# Patient Record
Sex: Female | Born: 1990 | Race: Black or African American | Hispanic: No | Marital: Single | Smoking: Never smoker
Health system: Southern US, Community
[De-identification: ages and names within clinical notes are randomized; demographics above are authoritative.]

## PROBLEM LIST (undated history)

## (undated) ENCOUNTER — Inpatient Hospital Stay (HOSPITAL_COMMUNITY): Payer: Self-pay

## (undated) DIAGNOSIS — Z789 Other specified health status: Secondary | ICD-10-CM

## (undated) HISTORY — PX: TONSILLECTOMY: SUR1361

---
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
## 2011-01-04 LAB — DIFFERENTIAL
Lymphocytes Relative: 31
Lymphs Abs: 1.1
Monocytes Relative: 16 — ABNORMAL HIGH
Neutro Abs: 1.8
Neutrophils Relative %: 52

## 2011-01-04 LAB — URINALYSIS, ROUTINE W REFLEX MICROSCOPIC
Glucose, UA: NEGATIVE
Leukocytes, UA: NEGATIVE
Protein, ur: NEGATIVE
Specific Gravity, Urine: 1.024
pH: 6

## 2011-01-04 LAB — COMPREHENSIVE METABOLIC PANEL
CO2: 23
Calcium: 8.6
Creatinine, Ser: 0.68
Glucose, Bld: 102 — ABNORMAL HIGH
Total Protein: 6.4

## 2011-01-04 LAB — URINE MICROSCOPIC-ADD ON

## 2011-01-04 LAB — CBC
Hemoglobin: 10.3 — ABNORMAL LOW
MCHC: 31.6
MCV: 78
RBC: 4.19
RDW: 16.2 — ABNORMAL HIGH

## 2017-03-27 ENCOUNTER — Inpatient Hospital Stay (HOSPITAL_COMMUNITY)
Admission: AD | Admit: 2017-03-27 | Discharge: 2017-03-27 | Disposition: A | Discharge status: Home / Self Care | Payer: Federal, State, Local not specified - PPO | Source: Ambulatory Visit | Attending: Obstetrics and Gynecology | Admitting: Obstetrics and Gynecology

## 2017-03-27 ENCOUNTER — Encounter (HOSPITAL_COMMUNITY): Payer: Self-pay | Admitting: *Deleted

## 2017-03-27 ENCOUNTER — Inpatient Hospital Stay (HOSPITAL_COMMUNITY): Payer: Federal, State, Local not specified - PPO

## 2017-03-27 DIAGNOSIS — Z3A01 Less than 8 weeks gestation of pregnancy: Secondary | ICD-10-CM | POA: Insufficient documentation

## 2017-03-27 DIAGNOSIS — R109 Unspecified abdominal pain: Secondary | ICD-10-CM | POA: Insufficient documentation

## 2017-03-27 DIAGNOSIS — O200 Threatened abortion: Secondary | ICD-10-CM | POA: Diagnosis not present

## 2017-03-27 DIAGNOSIS — O26891 Other specified pregnancy related conditions, first trimester: Secondary | ICD-10-CM | POA: Diagnosis present

## 2017-03-27 DIAGNOSIS — O209 Hemorrhage in early pregnancy, unspecified: Secondary | ICD-10-CM

## 2017-03-27 HISTORY — DX: Other specified health status: Z78.9

## 2017-03-27 LAB — CBC
HCT: 37.4 % (ref 36.0–46.0)
Hemoglobin: 11.6 g/dL — ABNORMAL LOW (ref 12.0–15.0)
MCH: 24.9 pg — ABNORMAL LOW (ref 26.0–34.0)
MCHC: 31 g/dL (ref 30.0–36.0)
MCV: 80.3 fL (ref 78.0–100.0)
Platelets: 243 10*3/uL (ref 150–400)
RBC: 4.66 MIL/uL (ref 3.87–5.11)
RDW: 19.2 % — ABNORMAL HIGH (ref 11.5–15.5)
WBC: 11.8 10*3/uL — ABNORMAL HIGH (ref 4.0–10.5)

## 2017-03-27 LAB — URINALYSIS, ROUTINE W REFLEX MICROSCOPIC
Bilirubin Urine: NEGATIVE
Glucose, UA: NEGATIVE mg/dL
Ketones, ur: NEGATIVE mg/dL
Leukocytes, UA: NEGATIVE
Nitrite: NEGATIVE
Protein, ur: NEGATIVE mg/dL
Specific Gravity, Urine: 1.014 (ref 1.005–1.030)
pH: 5 (ref 5.0–8.0)

## 2017-03-27 LAB — HCG, QUANTITATIVE, PREGNANCY: hCG, Beta Chain, Quant, S: 1126 m[IU]/mL — ABNORMAL HIGH (ref <5)

## 2017-03-27 LAB — POCT PREGNANCY, URINE: Preg Test, Ur: POSITIVE — AB

## 2017-03-27 LAB — WET PREP, GENITAL
Clue Cells Wet Prep HPF POC: NONE SEEN
Sperm: NONE SEEN
Trich, Wet Prep: NONE SEEN
Yeast Wet Prep HPF POC: NONE SEEN

## 2017-03-27 LAB — ABO/RH: ABO/RH(D): O POS

## 2017-03-27 MED ORDER — ACETAMINOPHEN 500 MG PO TABS
1000.0000 mg | ORAL_TABLET | Freq: Once | ORAL | Status: AC
  Administered 2017-03-27: 1000 mg via ORAL
  Filled 2017-03-27: qty 2

## 2017-03-27 NOTE — MAU Note (Signed)
Pt presents to MAU c/o vaginal bleeding and abdominal cramping. Pt states that the bleeding started around 1830 pt states she has only wore one pad since then. Pt reports a lower abdominal cramping rating it at a 7/10.

## 2017-03-27 NOTE — MAU Provider Note (Signed)
Chief Complaint: Possible Pregnancy; Vaginal Bleeding; and Abdominal Pain   First Provider Initiated Contact with Patient 03/27/17 [phone]     SUBJECTIVE HPI: Lindsay Navarro is a 26 y.o. G1P0 at [redacted]w[redacted]d by LMP who presents to maternity admissions reporting vaginal bleeding and abdominal pain. [place] from [place]- Has had HCG level and US in [place]. Pulled up records on phone- HCG on 12/19 was 1,185 and US on 12/19 showed IUP @ 6w with small subchorionic hemorrhage and cardiac activity of 104. She reports that she started bleeding last night around 1900, it began as dark red bleeding then around 2300 turned bright red with small clots. Abdominal cramping began around 2300 with bright red bleeding- pain is specified to lower abdominal area, has not taken any medication for pain relief. Abdominal pain continued to get worse overnight- which brought her into MAU this morning. She denies vaginal itching/burning, urinary symptoms, h/a, dizziness, n/v, or fever/chills.    Past Medical History:  Diagnosis Date  . Medical history non-contributory    Past Surgical History:  Procedure Laterality Date  . TONSILLECTOMY     Social History   Socioeconomic History  . Marital status: Single    Spouse name: Not on file  . Number of children: Not on file  . Years of education: Not on file  . Highest education level: Not on file  Social Needs  . Financial resource strain: Not on file  . Food insecurity - worry: Not on file  . Food insecurity - inability: Not on file  . Transportation needs - medical: Not on file  . Transportation needs - non-medical: Not on file  Occupational History  . Not on file  Tobacco Use  . Smoking status: Never Smoker  . Smokeless tobacco: Never Used  Substance and Sexual Activity  . Alcohol use: No    Frequency: Never  . Drug use: No  . Sexual activity: Not Currently  Other Topics Concern  . Not on file  Social History Narrative  . Not on file    No current facility-administered medications on file prior to encounter.    Current Outpatient Medications on File Prior to Encounter  Medication Sig Dispense Refill  . acetaminophen (TYLENOL) 325 MG tablet Take 650 mg by mouth every 6 (six) hours as needed.    . IRON CR PO Take by mouth.    . Prenatal Vit-Fe Fumarate-FA (PRENATAL MULTIVITAMIN) TABS tablet Take 1 tablet by mouth daily at 12 noon.    . progesterone (PROMETRIUM) 200 MG capsule Take 200 mg by mouth daily.     No Known Allergies  ROS:  Review of Systems  Constitutional: Negative.   Respiratory: Negative.   Cardiovascular: Negative.   Gastrointestinal: Positive for abdominal pain. Negative for constipation, diarrhea, nausea and vomiting.  Genitourinary: Positive for vaginal bleeding. Negative for difficulty urinating, dysuria, frequency, urgency and vaginal pain.  Musculoskeletal: Negative.   Skin: Negative.   Neurological: Negative.   Psychiatric/Behavioral: Negative.    I have reviewed patient's Past Medical Hx, Surgical Hx, Family Hx, Social Hx, medications and allergies.   Physical Exam   Patient Vitals for the past 24 hrs:  BP Temp Temp src Pulse Resp Height Weight  03/27/17 0628 133/64 98.4 F (36.9 C) Oral 91 18 5\' 9"  (1.753 m) 299 lb 0.6 oz (135.6 kg)   Constitutional: Well-developed, well-nourished female in no acute distress.  Cardiovascular: normal rate Respiratory: normal effort GI: Abd soft, non-tender. Pos BS x 4 MS:  Extremities nontender, no edema, normal ROM Neurologic: Alert and oriented x 4.  GU: Neg CVAT.  PELVIC EXAM: Cervix pink, visually closed, without lesion, moderate dark red blood with multiple small clots in vagina upon insertion of speculum, active bright red bleeding with mucus discharge from cervix, vaginal walls and external genitalia normal Bimanual exam: Cervix 0/long/high, firm, anterior, neg CMT, uterus nontender, nonenlarged, adnexa without tenderness, enlargement, or  mass  LAB RESULTS Results for orders placed or performed during the hospital encounter of 03/27/17 (from the past 24 hour(s))  Urinalysis, Routine w reflex microscopic     Status: Abnormal   Collection Time: 03/27/17  6:14 AM  Result Value Ref Range   Color, Urine YELLOW YELLOW   APPearance HAZY (A) CLEAR   Specific Gravity, Urine 1.014 1.005 - 1.030   pH 5.0 5.0 - 8.0   Glucose, UA NEGATIVE NEGATIVE mg/dL   Hgb urine dipstick LARGE (A) NEGATIVE   Bilirubin Urine NEGATIVE NEGATIVE   Ketones, ur NEGATIVE NEGATIVE mg/dL   Protein, ur NEGATIVE NEGATIVE mg/dL   Nitrite NEGATIVE NEGATIVE   Leukocytes, UA NEGATIVE NEGATIVE   RBC / HPF 6-30 0 - 5 RBC/hpf   WBC, UA 0-5 0 - 5 WBC/hpf   Bacteria, UA RARE (A) NONE SEEN   Squamous Epithelial / LPF 0-5 (A) NONE SEEN   Mucus PRESENT   Wet prep, genital     Status: Abnormal   Collection Time: 03/27/17  6:42 AM  Result Value Ref Range   Yeast Wet Prep HPF POC NONE SEEN NONE SEEN   Trich, Wet Prep NONE SEEN NONE SEEN   Clue Cells Wet Prep HPF POC NONE SEEN NONE SEEN   WBC, Wet Prep HPF POC FEW (A) NONE SEEN   Sperm NONE SEEN   CBC     Status: Abnormal   Collection Time: 03/27/17  6:44 AM  Result Value Ref Range   WBC 11.8 (H) 4.0 - 10.5 K/uL   RBC 4.66 3.87 - 5.11 MIL/uL   Hemoglobin 11.6 (L) 12.0 - 15.0 g/dL   HCT 69.637.4 29.536.0 - 28.446.0 %   MCV 80.3 78.0 - 100.0 fL   MCH 24.9 (L) 26.0 - 34.0 pg   MCHC 31.0 30.0 - 36.0 g/dL   RDW 13.219.2 (H) 44.011.5 - 10.215.5 %   Platelets 243 150 - 400 K/uL  ABO/Rh     Status: None (Preliminary result)   Collection Time: 03/27/17  6:44 AM  Result Value Ref Range   ABO/RH(D) O POS   hCG, quantitative, pregnancy     Status: Abnormal   Collection Time: 03/27/17  6:44 AM  Result Value Ref Range   hCG, Beta Chain, Quant, S 1,126 (H) <5 mIU/mL  Pregnancy, urine POC     Status: Abnormal   Collection Time: 03/27/17  6:57 AM  Result Value Ref Range   Preg Test, Ur POSITIVE (A) NEGATIVE     IMAGING [place] Ob Less Than  14 Weeks With Ob Transvaginal  Result Date: 03/27/2017 CLINICAL DATA:  Spotting EXAM: OBSTETRIC <14 WK US AND TRANSVAGINAL OB US TECHNIQUE: Both transabdominal and transvaginal ultrasound examinations were performed for complete evaluation of the gestation as well as the maternal uterus, adnexal regions, and pelvic cul-de-sac. Transvaginal technique was performed to assess early pregnancy. COMPARISON:  None. FINDINGS: Intrauterine gestational sac: Single Yolk sac:  Visualized Embryo:  Visualized Cardiac Activity: Not visualized Heart Rate:   bpm MSD:   mm    w     d CRL:  6.1  mm   6 w   3 d                  [place] EDC: 11/20/2017 Subchorionic hemorrhage:  None visualized. Maternal uterus/adnexae: No adnexal masses or free fluid. IMPRESSION: Six week 3 day intrauterine pregnancy. Fetal heart tones not visualized currently. Recommend follow-up ultrasound in 10-14 days to ensure expected progression. Electronically Signed   By: Charlett Nose M.D.   On: 03/27/2017 07:51    MAU Management/MDM: Orders Placed This Encounter  Procedures  . Wet prep, genital  . US OB Comp Less 14 Wks  . US OB Transvaginal  . Urinalysis, Routine w reflex microscopic  . CBC  . hCG, quantitative, pregnancy  . ABO/Rh    Meds ordered this encounter  Medications  . acetaminophen (TYLENOL) tablet 1,000 mg   Treatments in MAU included 1,000mg  Tylenol for abdominal pain.  Discussed results of labs and ultrasound. Educated on recommendations at this time. Offered pain medication for use at home- patient declines.  Pt discharged with bleeding precautions and Pelvic rest. Patient agrees with expectant management and wants to continue needed care in [place]- has flight back tonight.   ASSESSMENT 1. Threatened miscarriage in early pregnancy   2. Bleeding in early pregnancy   3. Abdominal pain in pregnancy, first trimester     PLAN Discharge home Follow up with HCG in 1 week and office visit in 2 weeks  Return to MAU as  needed with increased bleeding and increased pain    Allergies as of 03/27/2017   No Known Allergies     Medication List    TAKE these medications   acetaminophen 325 MG tablet Commonly known as:  TYLENOL Take 650 mg by mouth every 6 (six) hours as needed.   IRON CR PO Take by mouth.   prenatal multivitamin Tabs tablet Take 1 tablet by mouth daily at 12 noon.   progesterone 200 MG capsule Commonly known as:  PROMETRIUM Take 200 mg by mouth daily.       Steward Drone  Certified Nurse-Midwife 03/27/2017  6:56 AM

## 2017-03-28 LAB — GC/CHLAMYDIA PROBE AMP (~~LOC~~) NOT AT ARMC
Chlamydia: NEGATIVE
Neisseria Gonorrhea: NEGATIVE

## 2018-02-09 ENCOUNTER — Encounter (HOSPITAL_COMMUNITY): Payer: Self-pay

## 2019-01-29 IMAGING — US US OB < 14 WEEKS - US OB TV
1 series · 15 of 28 positions shown · non-contrast
Comparison: None.

CLINICAL DATA: Spotting

EXAM:
OBSTETRIC <14 WK US AND TRANSVAGINAL OB US
TECHNIQUE: Both transabdominal and transvaginal ultrasound examinations were
performed for complete evaluation of the gestation as well as the
maternal uterus, adnexal regions, and pelvic cul-de-sac.
Transvaginal technique was performed to assess early pregnancy.

[Series 1: us ob < 14 weeks - us ob tv · 43 acquisitions, 15 frames shown]
[im 1/43]
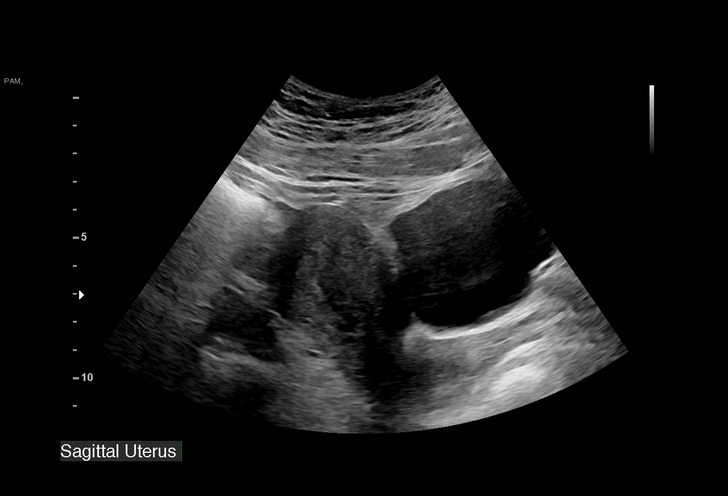
[im 4/43]
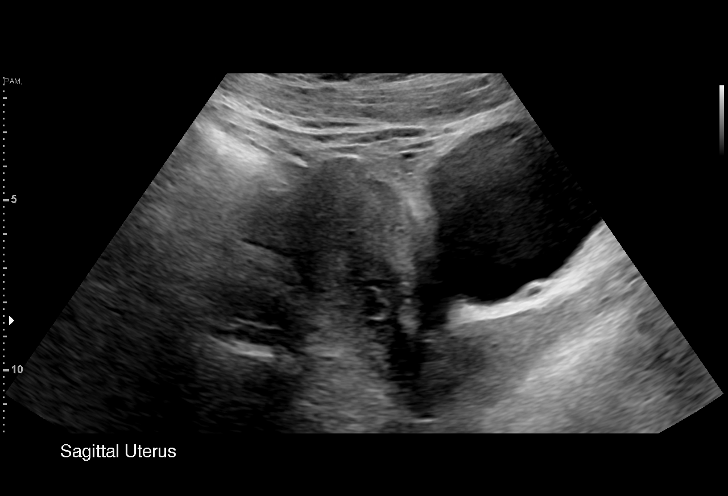
[im 7/43]
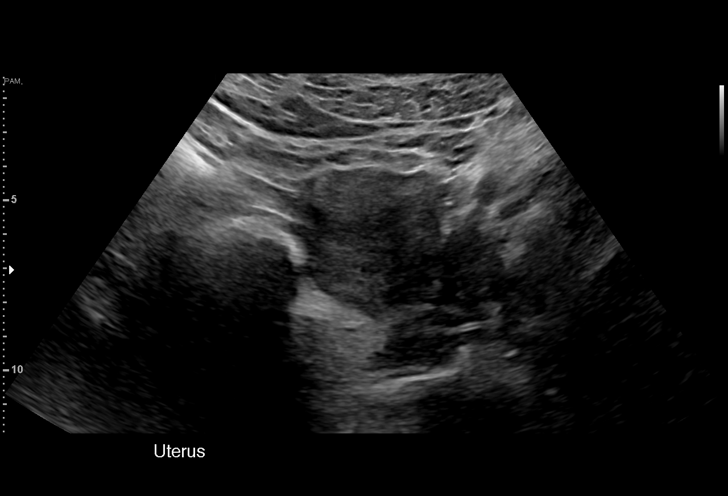
[im 10/43]
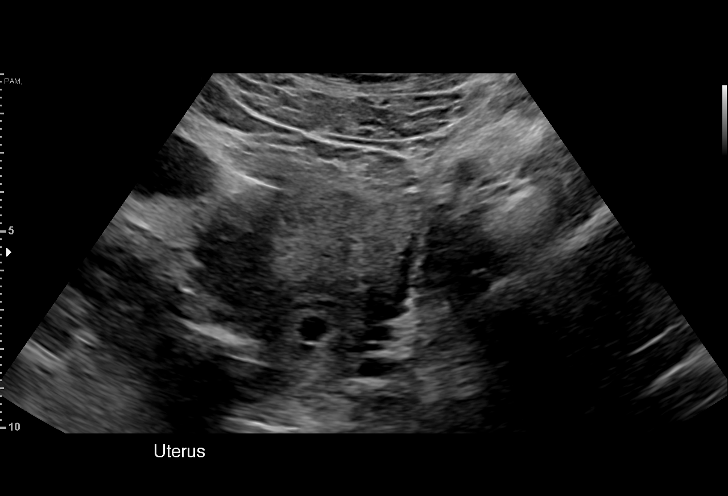
[im 13/43]
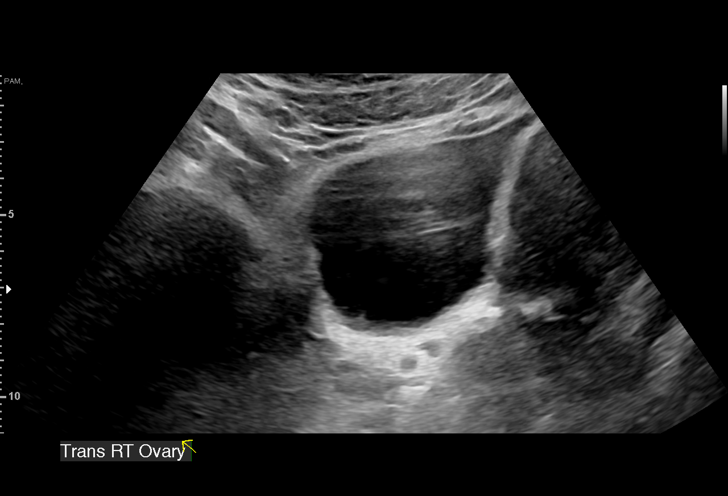
[im 16/43]
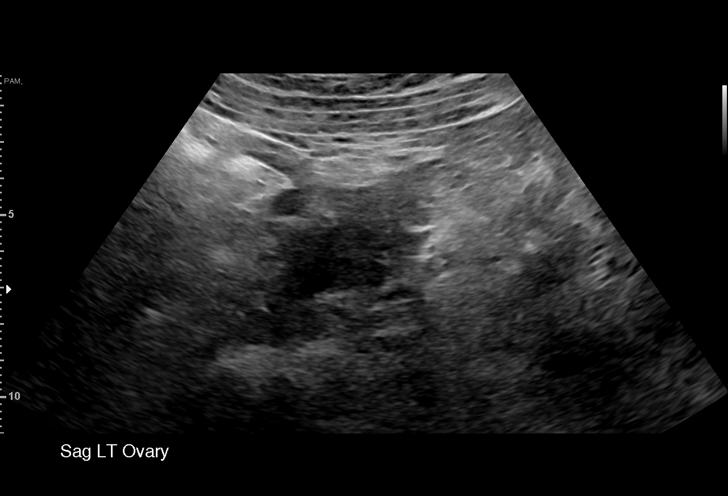
[im 19/43]
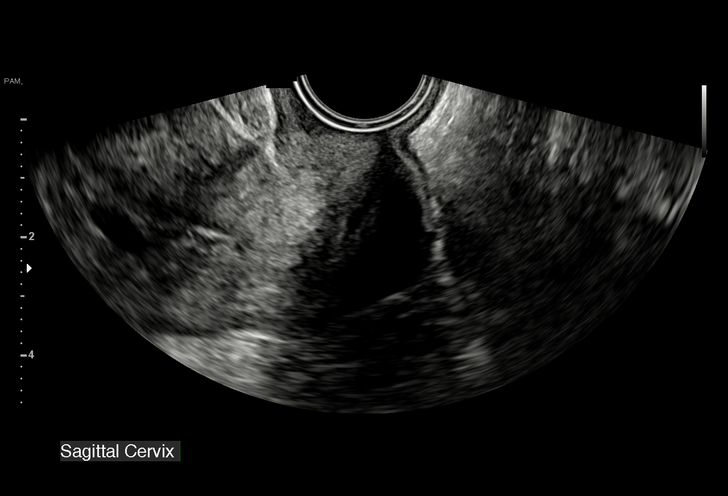
[im 22/43]
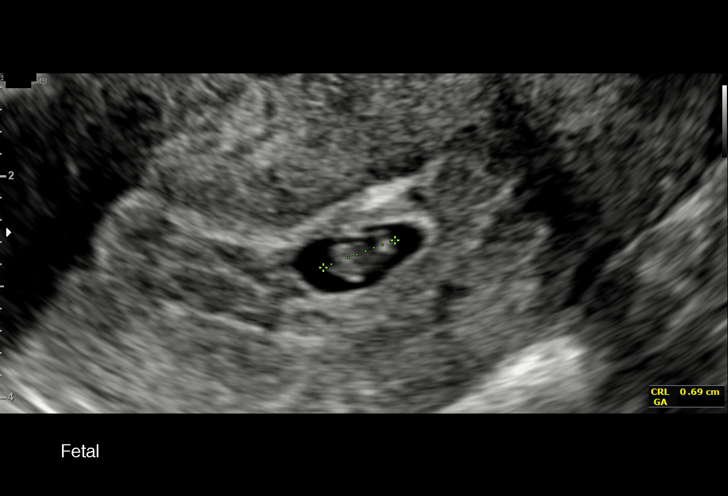
[im 24/43]
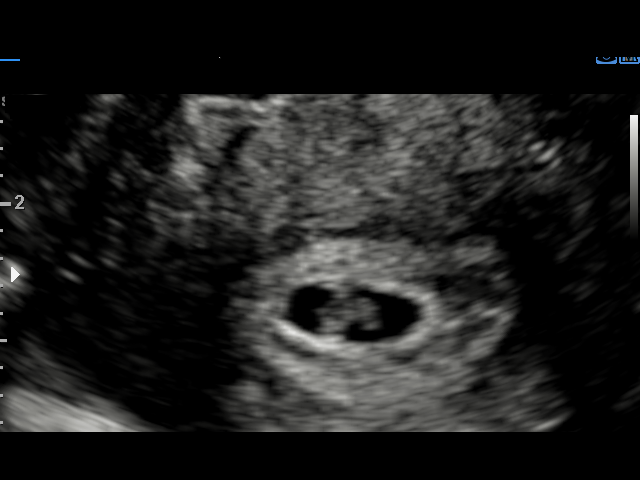
[im 27/43]
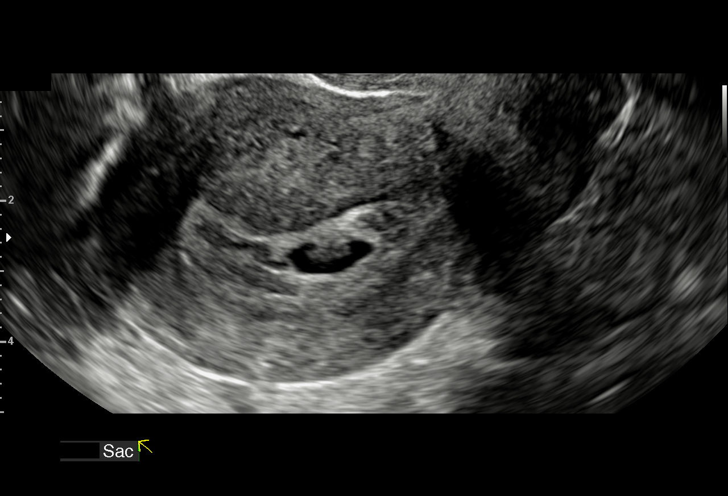
[im 30/43]
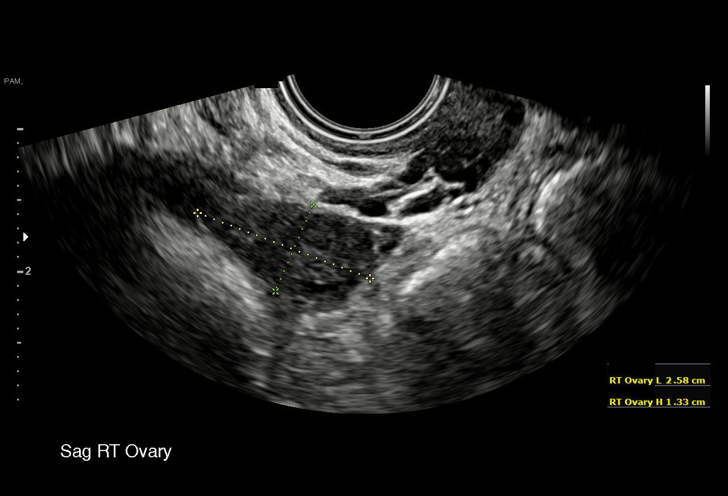
[im 33/43]
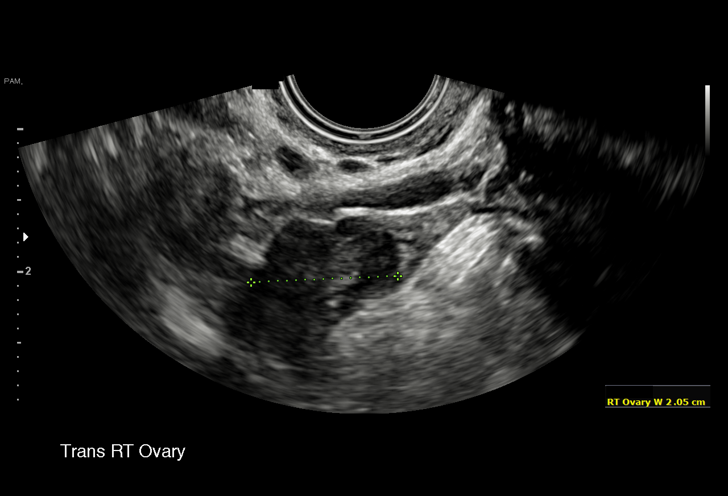
[im 36/43]
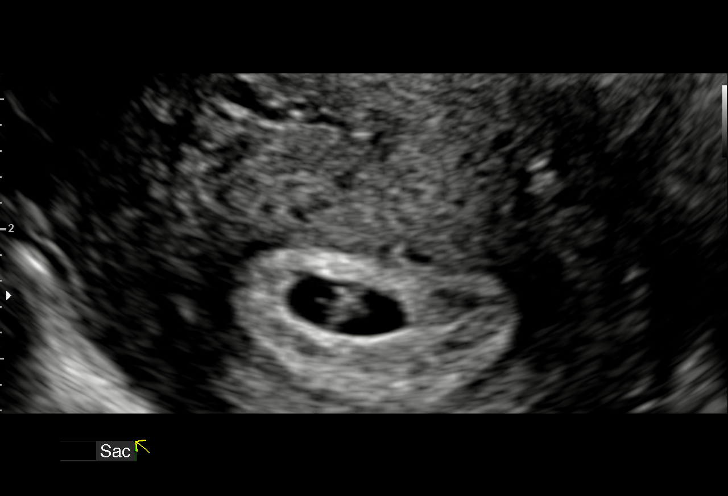
[im 39/43]
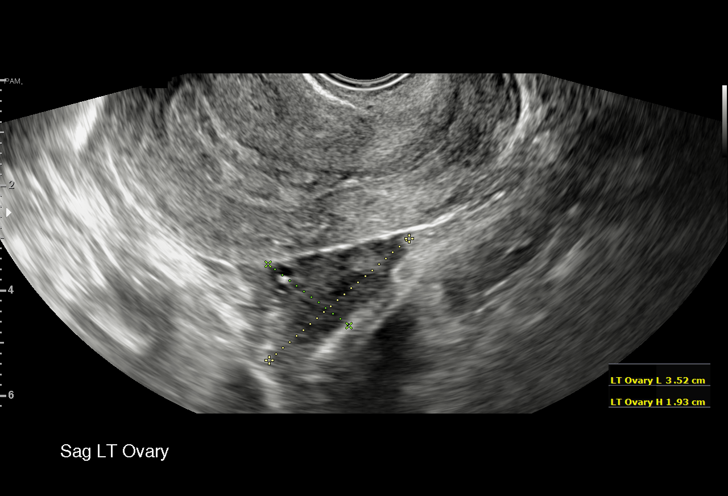
[im 43/43]
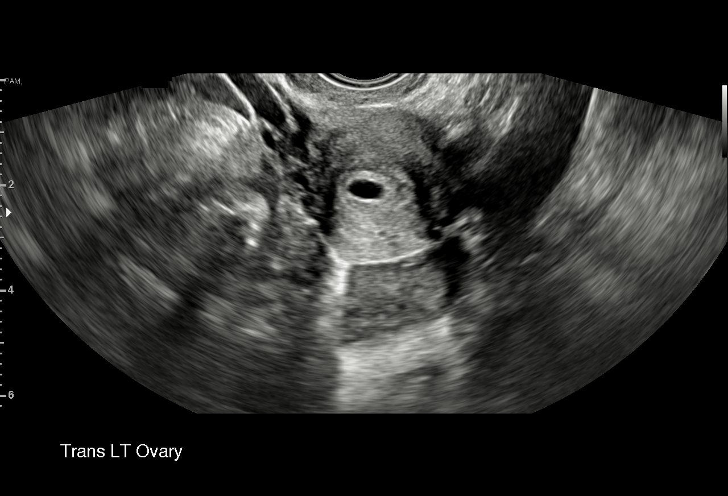

[15 of 28 positions shown; findings below may reference images not displayed]

FINDINGS: Intrauterine gestational sac: Single

Yolk sac:  Visualized

Embryo:  Visualized

Cardiac Activity: Not visualized

Heart Rate:   bpm

MSD:   mm    w     d

CRL:  6.1  mm   6 w   3 d                  US EDC: 11/20/2017

Subchorionic hemorrhage:  None visualized.

Maternal uterus/adnexae: No adnexal masses or free fluid.
IMPRESSION: Six week 3 day intrauterine pregnancy. Fetal heart tones not
visualized currently. Recommend follow-up ultrasound in 10-14 days
to ensure expected progression.
